# Patient Record
Sex: Male | Born: 1960
Health system: Southern US, Community
[De-identification: ages and names within clinical notes are randomized; demographics above are authoritative.]

---

## 2019-02-06 ENCOUNTER — Other Ambulatory Visit: Payer: Self-pay

## 2019-02-06 ENCOUNTER — Emergency Department (HOSPITAL_BASED_OUTPATIENT_CLINIC_OR_DEPARTMENT_OTHER)
Admission: EM | Admit: 2019-02-06 | Discharge: 2019-02-06 | Disposition: A | Discharge status: Home / Self Care | Payer: BLUE CROSS/BLUE SHIELD | Attending: Emergency Medicine | Admitting: Emergency Medicine

## 2019-02-06 ENCOUNTER — Emergency Department (HOSPITAL_BASED_OUTPATIENT_CLINIC_OR_DEPARTMENT_OTHER): Payer: BLUE CROSS/BLUE SHIELD

## 2019-02-06 ENCOUNTER — Encounter (HOSPITAL_BASED_OUTPATIENT_CLINIC_OR_DEPARTMENT_OTHER): Payer: Self-pay

## 2019-02-06 DIAGNOSIS — M545 Low back pain, unspecified: Secondary | ICD-10-CM

## 2019-02-06 DIAGNOSIS — M5489 Other dorsalgia: Secondary | ICD-10-CM | POA: Diagnosis present

## 2019-02-06 MED ORDER — HYDROCODONE-ACETAMINOPHEN 5-325 MG PO TABS: 1.0000 | ORAL_TABLET | Freq: Four times a day (QID) | ORAL | 0 refills | Status: AC | PRN

## 2019-02-06 MED ORDER — CYCLOBENZAPRINE HCL 10 MG PO TABS: 10.0000 mg | ORAL_TABLET | Freq: Two times a day (BID) | ORAL | 0 refills | Status: AC | PRN

## 2019-02-06 MED ORDER — NAPROXEN 500 MG PO TABS: 500.0000 mg | ORAL_TABLET | Freq: Two times a day (BID) | ORAL | 0 refills | Status: AC

## 2019-02-06 MED FILL — HYDROCODON-APAP 5-325: 5-325 | 3 days supply | Qty: 10 | Fill #0

## 2019-02-06 MED FILL — NAPROXEN 500 MG TABLET: 500 | 7 days supply | Qty: 14 | Fill #0

## 2019-02-06 MED FILL — CYCLOBENZAPRINE HCL 10 MG T: 10 | 10 days supply | Qty: 20 | Fill #0

## 2019-02-06 NOTE — Discharge Instructions (Addendum)
Make an appointment with sports medicine upstairs for follow-up.  Work note provided.  X-rays of back did show some narrowing at the lumbar third and fourth level.  Take the Naprosyn as directed for the next 7 days.  Supplement with the hydrocodone as needed for additional pain relief.  Take the Flexeril as a muscle relaxer will make you sleepy may want to just take it at bedtime.  Return for any new or worse symptoms.

## 2019-02-06 NOTE — ED Triage Notes (Signed)
Pt reports back pain over last several weeks, worse today upon waking up, right lower back pain.  Denies acute injury, strain over time.  Ambulates with steady gait. Denies any medical history, takes no medications.  Took aleve this morning, little improvement.

## 2019-02-06 NOTE — ED Provider Notes (Signed)
[facility] EMERGENCY DEPARTMENT Provider Note   CSN: [id number] Arrival date & time: 02/06/19  2458    History   Chief Complaint Chief Complaint  Patient presents with  . Back Pain    HPI Derrick Adkins is a 58 y.o. male.     Patient with complaint of right lower back pain and on and off for a month.  Has not had any x-rays.  Currently does not have a primary care provider.  Patient used to be in the [employer] and occasionally would have some back pain.  Pain is on the right side of the lower back.  Does not radiate into the legs.  No numbness or weakness in the legs.  No fall or injury.     History reviewed. No pertinent past medical history.  There are no active problems to display for this patient.   History reviewed. No pertinent surgical history.      Home Medications    Prior to Admission medications   Medication Sig Start Date End Date Taking? Authorizing Provider  cyclobenzaprine (FLEXERIL) 10 MG tablet Take 1 tablet (10 mg total) by mouth 2 (two) times daily as needed for muscle spasms. 02/06/19   Vanetta Mulders, MD  HYDROcodone-acetaminophen (NORCO/VICODIN) 5-325 MG tablet Take 1 tablet by mouth every 6 (six) hours as needed for moderate pain. 02/06/19   Vanetta Mulders, MD  naproxen (NAPROSYN) 500 MG tablet Take 1 tablet (500 mg total) by mouth 2 (two) times daily. 02/06/19   Vanetta Mulders, MD    Family History History reviewed. No pertinent family history.  Social History Social History   Tobacco Use  . Smoking status: Never Smoker  . Smokeless tobacco: Never Used  Substance Use Topics  . Alcohol use: Never    Frequency: Never  . Drug use: Never     Allergies   Codeine and Sulfa antibiotics   Review of Systems Review of Systems  Constitutional: Negative for chills and fever.  HENT: Negative for rhinorrhea and sore throat.   Eyes: Negative for visual disturbance.  Respiratory: Negative for cough and shortness of breath.    Cardiovascular: Negative for chest pain and leg swelling.  Gastrointestinal: Negative for abdominal pain, diarrhea, nausea and vomiting.  Genitourinary: Negative for dysuria.  Musculoskeletal: Positive for back pain. Negative for neck pain.  Skin: Negative for rash.  Neurological: Negative for dizziness, weakness, light-headedness, numbness and headaches.  Hematological: Does not bruise/bleed easily.  Psychiatric/Behavioral: Negative for confusion.     Physical Exam Updated Vital Signs BP 129/84 (BP Location: Left Arm)   Pulse 79   Temp 98.2 F (36.8 C) (Oral)   Resp 16   Ht 1.829 m (6')   Wt 106.6 kg   SpO2 100%   BMI 31.87 kg/m   Physical Exam Vitals signs and nursing note reviewed.  Constitutional:      General: He is not in acute distress.    Appearance: Normal appearance. He is well-developed.  HENT:     Head: Normocephalic and atraumatic.     Mouth/Throat:     Mouth: Mucous membranes are moist.  Eyes:     Extraocular Movements: Extraocular movements intact.     Conjunctiva/sclera: Conjunctivae normal.  Neck:     Musculoskeletal: Normal range of motion and neck supple.  Cardiovascular:     Rate and Rhythm: Normal rate and regular rhythm.     Heart sounds: No murmur.  Pulmonary:     Effort: Pulmonary effort is normal. No respiratory distress.  Breath sounds: Normal breath sounds.  Abdominal:     Palpations: Abdomen is soft.     Tenderness: There is no abdominal tenderness.  Musculoskeletal: Normal range of motion.     Comments: No tenderness to palpation to the lumbar spine area.  No tenderness to palpation right CVA or left CVA area.  Skin:    General: Skin is warm and dry.     Capillary Refill: Capillary refill takes less than 2 seconds.  Neurological:     Mental Status: He is alert and oriented to person, place, and time.     Cranial Nerves: No cranial nerve deficit.     Sensory: No sensory deficit.     Motor: No weakness.      ED Treatments /  Results  Labs (all labs ordered are listed, but only abnormal results are displayed) Labs Reviewed - No data to display  EKG None  Radiology Dg Lumbar Spine Complete  Result Date: 02/06/2019 CLINICAL DATA:  Lumbago for approximately 1 month EXAM: LUMBAR SPINE - COMPLETE 4+ VIEW COMPARISON:  None. FINDINGS: Frontal, lateral, spot lumbosacral lateral, and bilateral oblique views were obtained. There are 5 non-rib-bearing lumbar type vertebral bodies. There is mild levoscoliosis. There is no fracture or spondylolisthesis. There is moderate disc space narrowing at L3-4. Other disc spaces appear normal. There are anterior osteophytes at L3, L4, and L5. There is mild facet osteoarthritic change at L3-4 on the left. Other facets appear unremarkable by radiography. IMPRESSION: Mild levoscoliosis. Osteoarthritic change at L3-4. No fracture or spondylolisthesis. Electronically Signed   By: Bretta Bang III M.D.   On: 02/06/2019 09:28    Procedures Procedures (including critical care time)  Medications Ordered in ED Medications - No data to display   Initial Impression / Assessment and Plan / ED Course  I have reviewed the triage vital signs and the nursing notes.  Pertinent labs & imaging results that were available during my care of the patient were reviewed by me and considered in my medical decision making (see chart for details).       Low back pain without evidence of sciatica.  X-rays shows him to narrowing at L3-L4 but more so to the left side patient's pain is on the right.  Will treat patient symptomatically give him referral to sports medicine.  Patient past medical history noncontributory will give 7-day course of Naprosyn and short course of hydrocodone to help with more severe pain and Flexeril.  Work note to let him rest the back for the next 2 days.   Final Clinical Impressions(s) / ED Diagnoses   Final diagnoses:  Acute right-sided low back pain without sciatica    ED  Discharge Orders         Ordered    naproxen (NAPROSYN) 500 MG tablet  2 times daily     02/06/19 0939    cyclobenzaprine (FLEXERIL) 10 MG tablet  2 times daily PRN     02/06/19 0939    HYDROcodone-acetaminophen (NORCO/VICODIN) 5-325 MG tablet  Every 6 hours PRN     02/06/19 0939           Vanetta Mulders, MD 02/06/19 [phone]

## 2020-03-02 IMAGING — CR LUMBAR SPINE - COMPLETE 4+ VIEW
5 series · 5 of 5 positions shown · non-contrast
Comparison: None.

CLINICAL DATA: Lumbago for approximately 1 month

EXAM:
LUMBAR SPINE - COMPLETE 4+ VIEW

[t l-spine a.p.]
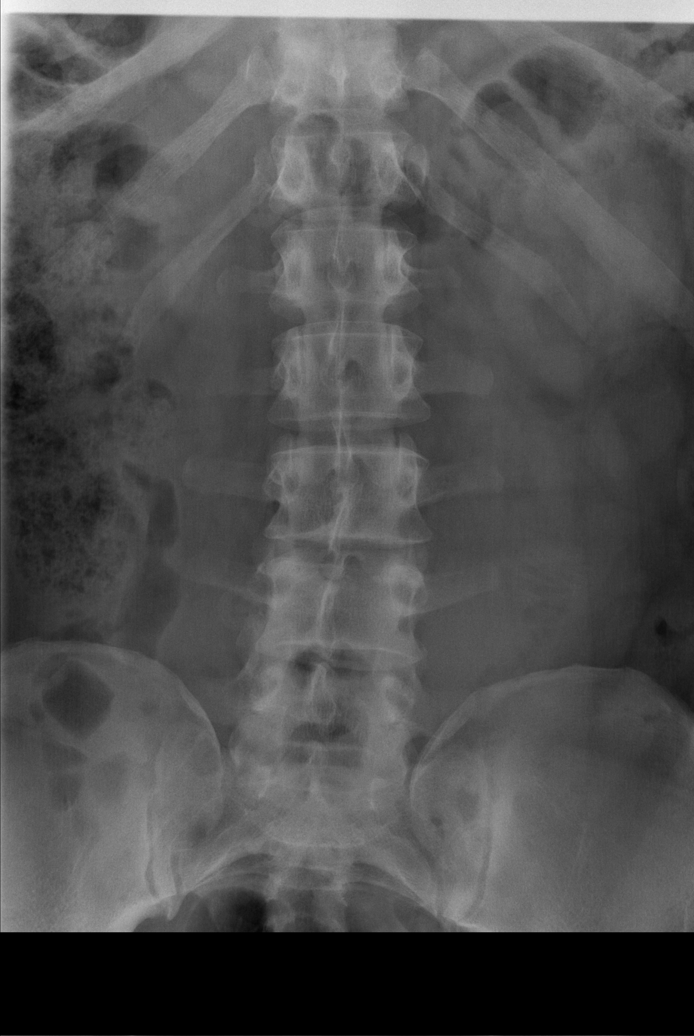

[t l-spine oblique exposure (1 of 2)]
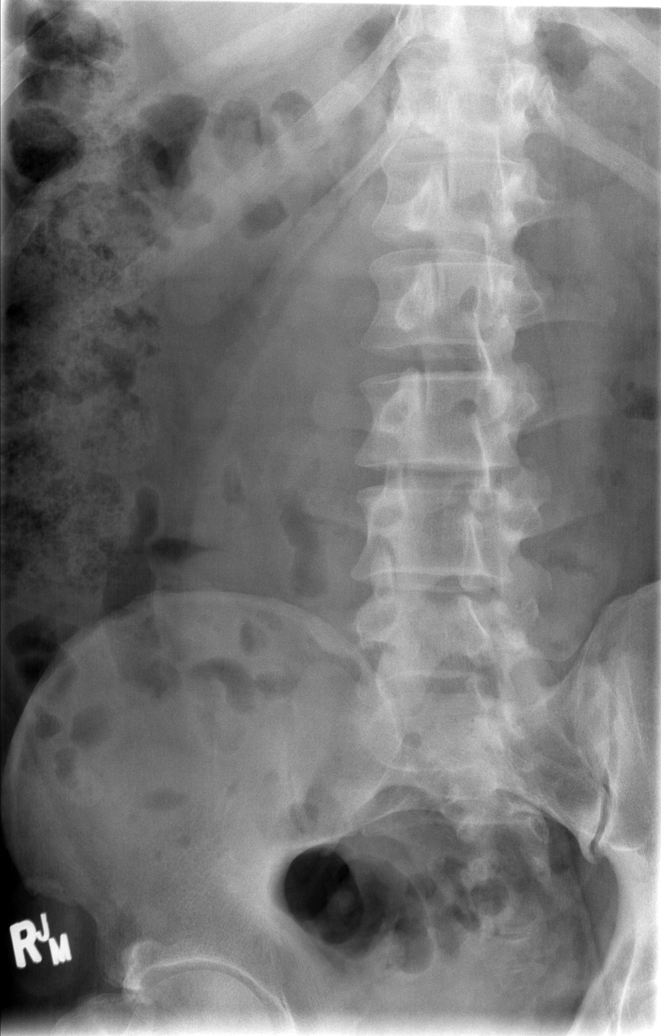

[t l-spine oblique exposure (2 of 2)]
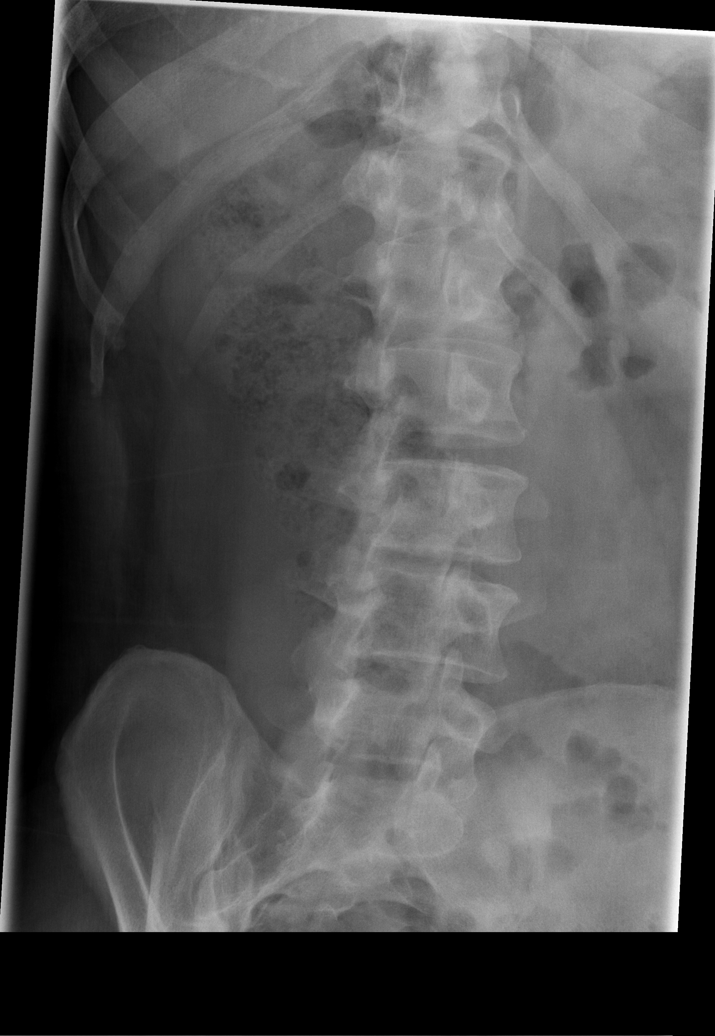

[t l-spine lat]
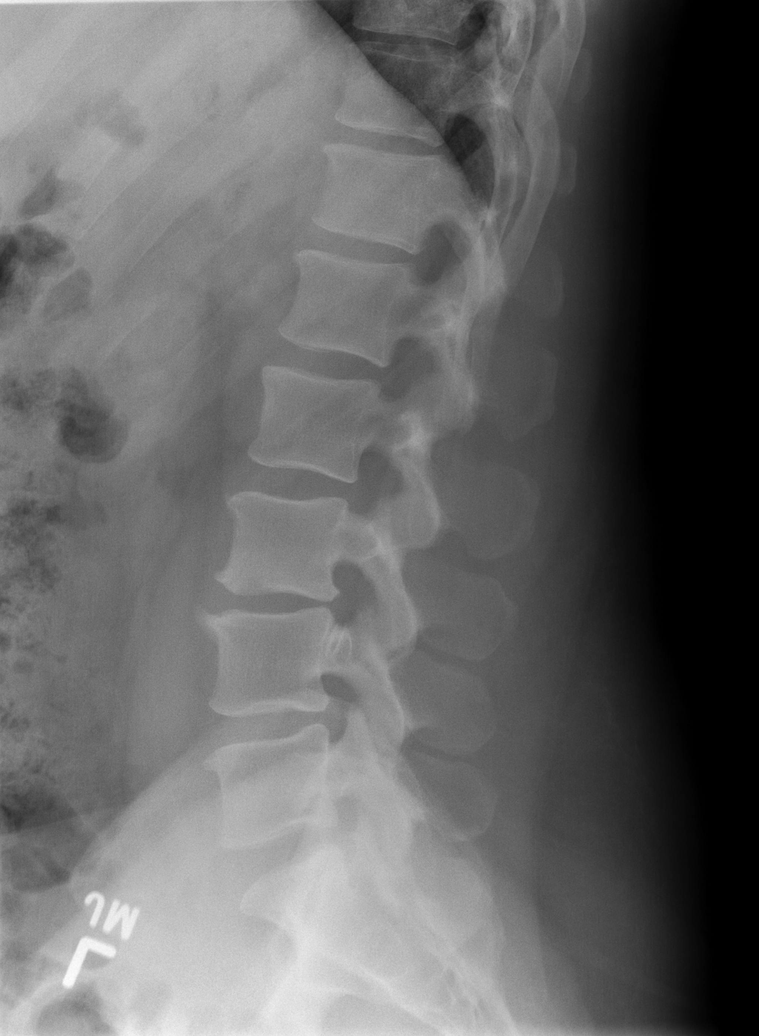

[t l-spine l5-s1 spot]
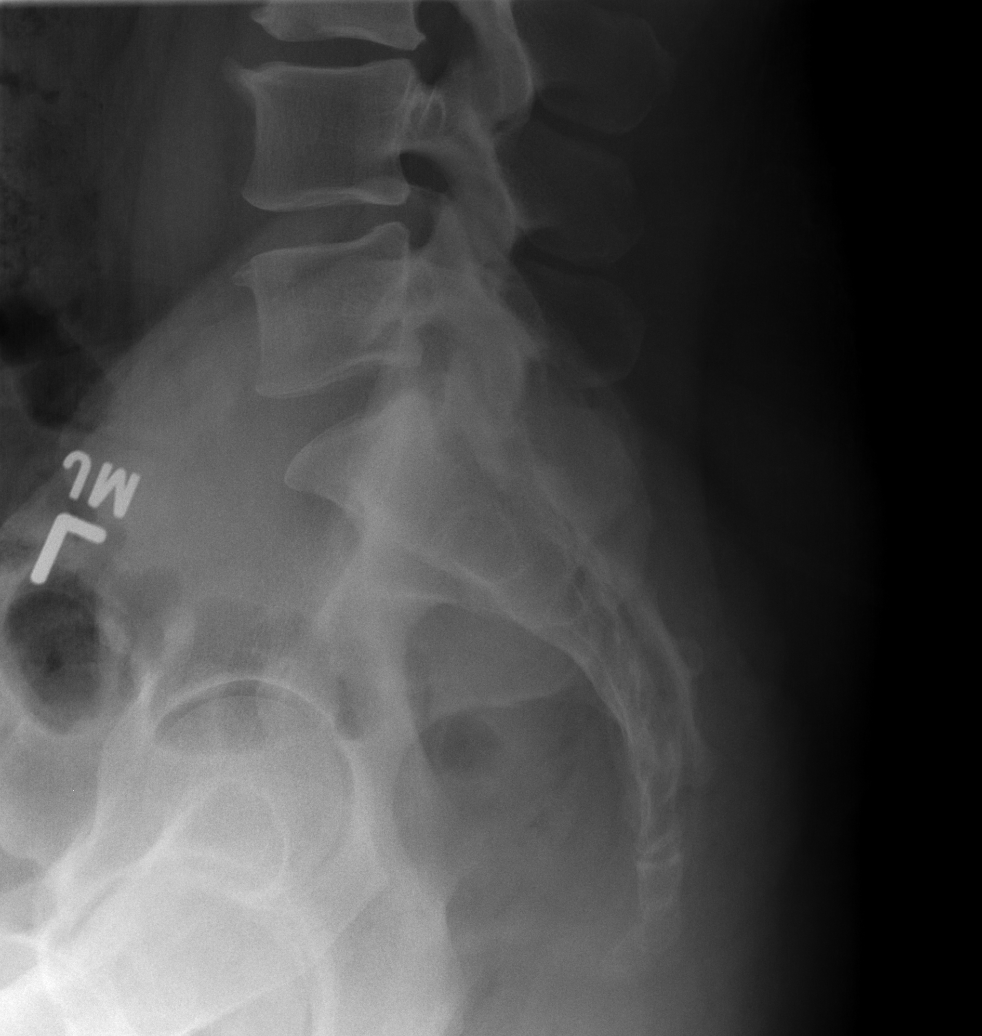

[5 of 5 positions shown; findings below may reference images not displayed]

FINDINGS: Frontal, lateral, spot lumbosacral lateral, and bilateral oblique
views were obtained. There are 5 non-rib-bearing lumbar type
vertebral bodies. There is mild levoscoliosis. There is no fracture
or spondylolisthesis. There is moderate disc space narrowing at
L3-4. Other disc spaces appear normal. There are anterior
osteophytes at L3, L4, and L5. There is mild facet osteoarthritic
change at L3-4 on the left. Other facets appear unremarkable by
radiography.
IMPRESSION: Mild levoscoliosis. Osteoarthritic change at L3-4. No fracture or
spondylolisthesis.
